# Patient Record
Sex: Male | Born: 1985 | Race: Black or African American | Hispanic: No | Marital: Single | Smoking: Never smoker
Health system: Southern US, Community
[De-identification: ages and names within clinical notes are randomized; demographics above are authoritative.]

---

## 2011-12-16 ENCOUNTER — Emergency Department (HOSPITAL_COMMUNITY)
Admission: EM | Admit: 2011-12-16 | Discharge: 2011-12-16 | Disposition: A | Discharge status: Home / Self Care | Payer: Self-pay | Attending: Emergency Medicine | Admitting: Emergency Medicine

## 2011-12-16 DIAGNOSIS — K047 Periapical abscess without sinus: Secondary | ICD-10-CM | POA: Insufficient documentation

## 2011-12-16 DIAGNOSIS — K029 Dental caries, unspecified: Secondary | ICD-10-CM | POA: Insufficient documentation

## 2011-12-16 DIAGNOSIS — R220 Localized swelling, mass and lump, head: Secondary | ICD-10-CM | POA: Insufficient documentation

## 2011-12-16 DIAGNOSIS — K089 Disorder of teeth and supporting structures, unspecified: Secondary | ICD-10-CM | POA: Insufficient documentation

## 2011-12-16 MED ORDER — AMOXICILLIN 500 MG PO CAPS: 500.0000 mg | ORAL_CAPSULE | Freq: Three times a day (TID) | ORAL | Status: AC

## 2011-12-16 MED ORDER — AMOXICILLIN 500 MG PO CAPS
1000.0000 mg | ORAL_CAPSULE | Freq: Once | ORAL | Status: AC
  Administered 2011-12-16: 1000 mg via ORAL
  Filled 2011-12-16: qty 2

## 2011-12-16 MED ORDER — OXYCODONE-ACETAMINOPHEN 5-325 MG PO TABS: 2.0000 | ORAL_TABLET | ORAL | Status: AC | PRN

## 2011-12-16 MED ORDER — HYDROCODONE-ACETAMINOPHEN 5-325 MG PO TABS
2.0000 | ORAL_TABLET | Freq: Once | ORAL | Status: AC
  Administered 2011-12-16: 2 via ORAL
  Filled 2011-12-16: qty 2

## 2011-12-16 NOTE — ED Provider Notes (Signed)
History     CSN: [id number] Arrival date & time: 12/16/2011 12:13 PM   First MD Initiated Contact with Patient 12/16/11 1551      Chief Complaint  Patient presents with  . Dental Pain    (Consider location/radiation/quality/duration/timing/severity/associated sxs/prior treatment) HPI... right lower tooth pain for several days. States filling fell out.  No fever, chills, stiff neck. The patient makes it worse. Slight facial swelling  Past Medical History  Diagnosis Date  . Asthma     History reviewed. No pertinent past surgical history.  History reviewed. No pertinent family history.  History  Substance Use Topics  . Smoking status: Never Smoker   . Smokeless tobacco: Not on file  . Alcohol Use: No      Review of Systems  All other systems reviewed and are negative.    Allergies  Review of patient's allergies indicates no known allergies.  Home Medications   Current Outpatient Rx  Name Route Sig Dispense Refill  . AMOXICILLIN 500 MG PO CAPS Oral Take 1 capsule (500 mg total) by mouth 3 (three) times daily. 30 capsule 0  . OXYCODONE-ACETAMINOPHEN 5-325 MG PO TABS Oral Take 2 tablets by mouth every 4 (four) hours as needed for pain. 20 tablet 0    BP 131/71  Pulse 69  Temp(Src) 98.8 F (37.1 C) (Oral)  Resp 8  Ht 6\' 4"  (1.93 m)  Wt 195 lb (88.451 kg)  BMI 23.74 kg/m2  SpO2 100%  Physical Exam  Nursing note and vitals reviewed. Constitutional: He is oriented to person, place, and time. He appears well-developed and well-nourished.       Respirations 18  HENT:  Mouth/Throat:         Decayed tooth with filling out  Neck: Normal range of motion. Neck supple.  Musculoskeletal: Normal range of motion.  Neurological: He is alert and oriented to person, place, and time. He has normal reflexes.  Skin: Skin is warm and dry.  Psychiatric: He has a normal mood and affect.    ED Course  Procedures (including critical care time)  Labs Reviewed - No data  to display No results found.   1. Dental abscess       MDM  Antibiotics and pain meds, referral to dentist        Donnetta Hutching, MD 12/16/11 [phone]

## 2011-12-16 NOTE — ED Notes (Signed)
Rt. Lower toothache for  A few day, facial swelling and pain noted.  Pt. Reports that filling fell out.

## 2012-07-26 ENCOUNTER — Emergency Department (HOSPITAL_COMMUNITY)
Admission: EM | Admit: 2012-07-26 | Discharge: 2012-07-26 | Disposition: A | Discharge status: Home / Self Care | Payer: No Typology Code available for payment source | Attending: Emergency Medicine | Admitting: Emergency Medicine

## 2012-07-26 ENCOUNTER — Emergency Department (HOSPITAL_COMMUNITY): Payer: No Typology Code available for payment source

## 2012-07-26 ENCOUNTER — Encounter (HOSPITAL_COMMUNITY): Payer: Self-pay | Admitting: *Deleted

## 2012-07-26 DIAGNOSIS — S8000XA Contusion of unspecified knee, initial encounter: Secondary | ICD-10-CM | POA: Insufficient documentation

## 2012-07-26 DIAGNOSIS — S39012A Strain of muscle, fascia and tendon of lower back, initial encounter: Secondary | ICD-10-CM

## 2012-07-26 DIAGNOSIS — Y92410 Unspecified street and highway as the place of occurrence of the external cause: Secondary | ICD-10-CM | POA: Insufficient documentation

## 2012-07-26 DIAGNOSIS — S335XXA Sprain of ligaments of lumbar spine, initial encounter: Secondary | ICD-10-CM | POA: Insufficient documentation

## 2012-07-26 MED ORDER — TRAMADOL HCL 50 MG PO TABS: 50.0000 mg | ORAL_TABLET | Freq: Four times a day (QID) | ORAL | Status: AC | PRN
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
## 2016-03-25 ENCOUNTER — Encounter (HOSPITAL_COMMUNITY): Payer: Self-pay | Admitting: Emergency Medicine

## 2016-03-25 ENCOUNTER — Emergency Department (HOSPITAL_COMMUNITY)
Admission: EM | Admit: 2016-03-25 | Discharge: 2016-03-25 | Disposition: A | Discharge status: Home / Self Care | Payer: No Typology Code available for payment source | Attending: Emergency Medicine | Admitting: Emergency Medicine

## 2016-03-25 DIAGNOSIS — X12XXXA Contact with other hot fluids, initial encounter: Secondary | ICD-10-CM | POA: Insufficient documentation

## 2016-03-25 DIAGNOSIS — Z23 Encounter for immunization: Secondary | ICD-10-CM | POA: Insufficient documentation

## 2016-03-25 DIAGNOSIS — Y9289 Other specified places as the place of occurrence of the external cause: Secondary | ICD-10-CM | POA: Insufficient documentation

## 2016-03-25 DIAGNOSIS — J45909 Unspecified asthma, uncomplicated: Secondary | ICD-10-CM | POA: Insufficient documentation

## 2016-03-25 DIAGNOSIS — Y9389 Activity, other specified: Secondary | ICD-10-CM | POA: Insufficient documentation

## 2016-03-25 DIAGNOSIS — Y990 Civilian activity done for income or pay: Secondary | ICD-10-CM | POA: Insufficient documentation

## 2016-03-25 DIAGNOSIS — T23102A Burn of first degree of left hand, unspecified site, initial encounter: Secondary | ICD-10-CM | POA: Insufficient documentation

## 2016-03-25 MED ORDER — OXYCODONE-ACETAMINOPHEN 5-325 MG PO TABS
2.0000 | ORAL_TABLET | Freq: Once | ORAL | Status: AC
  Administered 2016-03-25: 2 via ORAL
  Filled 2016-03-25: qty 2

## 2016-03-25 MED ORDER — SILVER SULFADIAZINE 1 % EX CREA
TOPICAL_CREAM | Freq: Once | CUTANEOUS | Status: AC
  Administered 2016-03-25: 04:00:00 via TOPICAL
  Filled 2016-03-25: qty 50

## 2016-03-25 MED ORDER — OXYCODONE HCL 5 MG PO TABS
5.0000 mg | ORAL_TABLET | Freq: Two times a day (BID) | ORAL | Status: AC | PRN
Start: 2016-03-25 — End: ?

## 2016-03-25 MED ORDER — TETANUS-DIPHTH-ACELL PERTUSSIS 5-2.5-18.5 LF-MCG/0.5 IM SUSP
0.5000 mL | Freq: Once | INTRAMUSCULAR | Status: AC
  Administered 2016-03-25: 0.5 mL via INTRAMUSCULAR
  Filled 2016-03-25: qty 0.5
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
## 2016-03-25 NOTE — Discharge Instructions (Signed)
[name], use silvadene cream 3 times per day until burn has completely healed.  See a primary care doctor within 3 days for wound check.  For any worsening symptoms or pain, come back to the ED immediately. Thank you. Burns hurt your skin. When your skin is hurt, it is easier to get an infection. Follow your doctor's directions to help prevent an infection. HOME CARE  Wash your hands well before you change your bandage.  Change your bandage as often as told by your doctor.  Remove the old bandage. If the bandage sticks, soak it off with cool, clean water.  Gently clean the burn with mild soap and water.  Pat the burn dry with a clean, dry cloth.  Put a thin layer of medicated cream on the burn.  Put a clean bandage on as told by your doctor.  Keep the bandage clean and dry.  Raise (elevate) the burn for the first 24 hours. After that, follow your doctor's directions.  Only take medicine as told by your doctor. GET HELP RIGHT AWAY IF:   You have too much pain.  The skin near the burn is red, tender, puffy (swollen), or has red streaks.  The burn area has yellowish white fluid (pus) or a bad smell coming from it.  You have a fever. MAKE SURE YOU:   Understand these instructions.  Will watch your condition.  Will get help right away if you are not doing well or get worse.   This information is not intended to replace advice given to you by your health care provider. Make sure you discuss any questions you have with your health care provider.   Document Released: 09/23/2008 Document Revised: 03/08/2012 Document Reviewed: 05/07/2011 Elsevier Interactive Patient Education [employer]2016 [employer] Inc.

## 2018-03-25 ENCOUNTER — Other Ambulatory Visit: Payer: Self-pay

## 2018-03-25 ENCOUNTER — Encounter (HOSPITAL_COMMUNITY): Payer: Self-pay | Admitting: Emergency Medicine

## 2018-03-25 ENCOUNTER — Emergency Department (HOSPITAL_COMMUNITY)
Admission: EM | Admit: 2018-03-25 | Discharge: 2018-03-25 | Disposition: A | Discharge status: Home / Self Care | Payer: Self-pay | Attending: Emergency Medicine | Admitting: Emergency Medicine

## 2018-03-25 DIAGNOSIS — R51 Headache: Secondary | ICD-10-CM | POA: Insufficient documentation

## 2018-03-25 DIAGNOSIS — R220 Localized swelling, mass and lump, head: Secondary | ICD-10-CM | POA: Insufficient documentation

## 2018-03-25 DIAGNOSIS — J45909 Unspecified asthma, uncomplicated: Secondary | ICD-10-CM | POA: Insufficient documentation

## 2018-03-25 DIAGNOSIS — K0889 Other specified disorders of teeth and supporting structures: Secondary | ICD-10-CM | POA: Insufficient documentation

## 2018-03-25 MED ORDER — PENICILLIN V POTASSIUM 500 MG PO TABS: 500.0000 mg | ORAL_TABLET | Freq: Four times a day (QID) | ORAL | 0 refills | Status: AC

## 2018-03-25 NOTE — ED Triage Notes (Addendum)
Pt reports 3 week hx of pain and pressure in r/side of face. Pt c/o pain in upper and lower teeth. Swelling noted in r/cheek.  Pt drove self to ED. Medicated with Tylenol at 3am today

## 2018-03-25 NOTE — Discharge Instructions (Signed)
Please take all of your antibiotics until finished!   You may develop abdominal discomfort or diarrhea from the antibiotic.  You may help offset this with probiotics which you can buy or get in yogurt. Do not eat  or take the probiotics until 2 hours after your antibiotic.  ° °Apply warm compresses to jaw throughout the day. Alternate 600 mg of ibuprofen and 500-1000 mg of Tylenol every 3 hours as needed for pain. Do not exceed 4000 mg of Tylenol daily.  You may also use warm water salt gargles, Orajel, or other over-the-counter dental pain remedies. Followup with a dentist is very important for ongoing evaluation and management of recurrent dental pain. Return to emergency department for emergent changing or worsening symptoms such as fever, worsening facial swelling, difficulty breathing or swallowing, throat tightness, or vision changes. ° °

## 2018-03-25 NOTE — ED Provider Notes (Signed)
[facility]-EMERGENCY DEPT Provider Note   CSN: [id number] Arrival date & time: 03/25/18  4098     History   Chief Complaint Chief Complaint  Patient presents with  . Dental Pain  . Facial Swelling    HPI Kevin Gay is a 32 y.o. male presents today for evaluation of gradual onset, progressively worsening right-sided upper and lower dental pain for 3 weeks. He notes constant aching throbbing pain to the right maxilla and mandible with intermittent radiation up to the right side of the face.  He notes improvement with Tylenol and Orajel.  He denies fevers or chills.  No drooling.  No shortness of breath.  He has not seen a dentist in "a while "but intends on going to a walk-in clinic tomorrow.  The history is provided by the patient.    Past Medical History:  Diagnosis Date  . Asthma     There are no active problems to display for this patient.   History reviewed. No pertinent surgical history.      Home Medications    Prior to Admission medications   Medication Sig Start Date End Date Taking? Authorizing Provider  oxyCODONE (ROXICODONE) 5 MG immediate release tablet Take 1 tablet (5 mg total) by mouth 2 (two) times daily as needed for severe pain. Patient not taking: Reported on 03/25/2018 03/25/16   Tomasita Crumble, MD  penicillin v potassium (VEETID) 500 MG tablet Take 1 tablet (500 mg total) by mouth 4 (four) times daily for 7 days. 03/25/18 04/01/18  Jeanie Sewer, PA-C    Family History History reviewed. No pertinent family history.  Social History Social History   Tobacco Use  . Smoking status: Never Smoker  Substance Use Topics  . Alcohol use: No  . Drug use: No     Allergies   Almond oil   Review of Systems Review of Systems  Constitutional: Negative for chills and fever.  HENT: Positive for dental problem. Negative for drooling, trouble swallowing and voice change.   Respiratory: Negative for shortness of breath.     Musculoskeletal: Negative for neck pain and neck stiffness.  Neurological: Positive for headaches.     Physical Exam Updated Vital Signs BP (!) 154/90 (BP Location: Right Arm)   Pulse 66   Temp 98.2 F (36.8 C) (Oral)   Resp 18   Wt 99.8 kg (220 lb)   SpO2 100%   BMI 26.78 kg/m   Physical Exam  Constitutional: He appears well-developed and well-nourished. No distress.  HENT:  Head: Normocephalic and atraumatic.  Dentition appears to be stable. No noted area of swelling or fluctuance. No trismus. Mouth opening to at least 3 finger widths. Handles oral secretions without difficulty. No noted facial swelling. No swelling or tenderness to the submental or submandibular regions. No swelling or tenderness into the soft tissues of the neck. He has mildly decaying dentition throughout with some missing dentition of the right mandible.  Mild tenderness to percussion of the right upper and lower dentition with no focal tenderness or area of fluctuance.  No significant gingival irritation noted    Eyes: Pupils are equal, round, and reactive to light. Conjunctivae and EOM are normal. Right eye exhibits no discharge. Left eye exhibits no discharge.  Neck: Normal range of motion. Neck supple. No JVD present. No tracheal deviation present.  Cardiovascular: Normal rate and regular rhythm.  Pulmonary/Chest: Effort normal and breath sounds normal.  Abdominal: He exhibits no distension.  Musculoskeletal: He exhibits no  edema.  Neurological: He is alert.  Skin: Skin is warm and dry. No erythema.  Psychiatric: He has a normal mood and affect. His behavior is normal.  Nursing note and vitals reviewed.    ED Treatments / Results  Labs (all labs ordered are listed, but only abnormal results are displayed) Labs Reviewed - No data to display  EKG None  Radiology No results found.  Procedures Procedures (including critical care time)  Medications Ordered in ED Medications - No data  to display   Initial Impression / Assessment and Plan / ED Course  I have reviewed the triage vital signs and the nursing notes.  Pertinent labs & imaging results that were available during my care of the patient were reviewed by me and considered in my medical decision making (see chart for details).     Patient with toothache.  Afebrile, vital signs are stable.  He is nontoxic in appearance.  Tolerating secretions without difficulty.  Speaking in full sentences without difficulty.  No angioedema noted.  No gross abscess.  Exam unconcerning for Ludwig's angina or spread of infection.  Will treat with penicillin and NSAIDs/Tylenol.  Urged patient to follow-up with dentist.  He states he will go to walk-in clinic tomorrow.  Discussed indications for return to the ED. Pt verbalized understanding of and agreement with plan and is safe for discharge home at this time.   Final Clinical Impressions(s) / ED Diagnoses   Final diagnoses:  Pain, dental    ED Discharge Orders        Ordered    penicillin v potassium (VEETID) 500 MG tablet  4 times daily     03/25/18 [id number]       Venice Marcucci A, PA-C 03/25/18 1203    Azalia Bilisampos, Kevin, MD 04/02/18 1218

## 2023-03-18 ENCOUNTER — Ambulatory Visit: Payer: Self-pay

## 2023-03-20 ENCOUNTER — Encounter: Payer: Self-pay | Admitting: Family

## 2023-03-20 ENCOUNTER — Ambulatory Visit: Payer: Self-pay | Admitting: Family

## 2023-03-20 DIAGNOSIS — Z113 Encounter for screening for infections with a predominantly sexual mode of transmission: Secondary | ICD-10-CM

## 2023-03-20 LAB — HM HIV SCREENING LAB: HM HIV Screening: NEGATIVE

## 2023-03-20 LAB — HM HEPATITIS C SCREENING LAB: HM Hepatitis Screen: NEGATIVE

## 2023-03-20 NOTE — Progress Notes (Signed)
Pt is here for STD screening.  Condoms given.  Chadwick Reiswig M Caryl Fate, RN  

## 2023-03-20 NOTE — Progress Notes (Signed)
[facility] Department STI clinic/screening visit  Subjective:  Danell Sisk is a 37 y.o. male being seen today for an STI screening visit. The patient reports they do not have symptoms.    Patient has the following medical conditions:  There are no problems to display for this patient.    Chief Complaint  Patient presents with   SEXUALLY TRANSMITTED DISEASE    Screening    HPI  Patient reports wants STI testing because he and his girlfriend tried anal sex in June 2023, which caused the skin on his penis to tear. States that it is still healing but wants it checked to make sure. Also, cancer runs in his family so he wants a testicular exam. He denies STI symptoms or exposure to STIs.  Last HIV test per patient/review of record was No results found for: "HMHIVSCREEN" No results found for: "HIV"  Does the patient or their partner desires a pregnancy in the next year? No  Screening for MPX risk: Does the patient have an unexplained rash? No Is the patient MSM? No Does the patient endorse multiple sex partners or anonymous sex partners? No Did the patient have close or sexual contact with a person diagnosed with MPX? No Has the patient traveled outside the [place] where MPX is endemic? No Is there a high clinical suspicion for MPX-- evidenced by one of the following No  -Unlikely to be chickenpox  -Lymphadenopathy  -Rash that present in same phase of evolution on any given body part   See flowsheet for further details and programmatic requirements.   Immunization History  Administered Date(s) Administered   Tdap 03/25/2016     The following portions of the patient's history were reviewed and updated as appropriate: allergies, current medications, past medical history, past social history, past surgical history and problem list.  Objective:  There were no vitals filed for this visit.  Physical Exam Constitutional:      Appearance: Normal appearance.  HENT:      Head: Normocephalic and atraumatic.     Comments: No nits or hair loss    Mouth/Throat:     Mouth: Mucous membranes are moist. No oral lesions.     Pharynx: Oropharynx is clear. No oropharyngeal exudate or posterior oropharyngeal erythema.  Eyes:     General:        Right eye: No discharge.        Left eye: No discharge.     Conjunctiva/sclera:     Right eye: Right conjunctiva is not injected. No exudate.    Left eye: Left conjunctiva is not injected. No exudate. Pulmonary:     Effort: Pulmonary effort is normal.  Abdominal:     General: Abdomen is flat.     Palpations: Abdomen is soft. There is no hepatomegaly or mass.     Tenderness: There is no abdominal tenderness. There is no rebound.     Hernia: There is no hernia in the left inguinal area or right inguinal area.  Genitourinary:    Pubic Area: No rash or pubic lice (no nits).      Penis: Circumcised. No tenderness, discharge, swelling or lesions.      Testes: Normal.     Epididymis:     Right: Normal. No mass or tenderness.     Left: Normal. No mass or tenderness.     Rectum: Normal. No tenderness (no lesions or discharge).     Comments: thick dry scar tissue on penis head adjacent to urethral  meatus and on penis shaft adjacent to corona Lymphadenopathy:     Head:     Right side of head: No preauricular or posterior auricular adenopathy.     Left side of head: No preauricular or posterior auricular adenopathy.     Cervical: No cervical adenopathy.     Upper Body:     Right upper body: No supraclavicular, axillary or epitrochlear adenopathy.     Left upper body: No supraclavicular, axillary or epitrochlear adenopathy.     Lower Body: No right inguinal adenopathy. No left inguinal adenopathy.  Skin:    General: Skin is warm and dry.     Findings: No lesion or rash.  Neurological:     Mental Status: He is alert and oriented to person, place, and time.       Assessment and Plan:  Clancy Lortz is a 37 y.o. male  presenting to the [facility] for STI screening  1. Screening for venereal disease Advised against anal sex, discussed male and male risks Apply cocoa butter cream to resolve scarred areas  Will contact with positive results Use condoms for all sex   Patient does not have STI symptoms Patient accepted all screenings including  urine GC/Chlamydia, and blood work for HIV/Syphilis. Patient meets criteria for HepB screening? No. Ordered? no Patient meets criteria for HepC screening? No. Ordered? yes Recommended condom use with all sex Discussed importance of condom use for STI prevent  Treat positive test results per standing order. Discussed time line for [facility] results and that patient will be called with positive results and encouraged patient to call if he had not heard in 2 weeks Recommended repeat testing in 3 months with positive results. Recommended returning for continued or worsening symptoms.   Return if symptoms worsen or fail to improve.  No future appointments.  Marline Backbone, FNP

## 2023-03-27 LAB — CHLAMYDIA/GC NAA, CONFIRMATION
Chlamydia trachomatis, NAA: POSITIVE — AB
Neisseria gonorrhoeae, NAA: NEGATIVE

## 2023-03-27 LAB — C. TRACHOMATIS NAA, CONFIRM: C. trachomatis NAA, Confirm: POSITIVE — AB

## 2023-03-27 LAB — GONOCOCCUS CULTURE: GC Culture Only: NEGATIVE

## 2023-03-30 ENCOUNTER — Telehealth: Payer: Self-pay

## 2023-03-30 NOTE — Telephone Encounter (Signed)
Pt notified of positive Chlamydia results.  Treatment appointment made for 03/31/2023

## 2023-03-30 NOTE — Telephone Encounter (Signed)
Unable to leave a message - VM is not set up.

## 2023-03-31 ENCOUNTER — Ambulatory Visit: Payer: Self-pay

## 2023-03-31 DIAGNOSIS — A749 Chlamydial infection, unspecified: Secondary | ICD-10-CM

## 2023-03-31 MED ORDER — DOXYCYCLINE HYCLATE 100 MG PO TABS: 100.0000 mg | ORAL_TABLET | Freq: Two times a day (BID) | ORAL | 0 refills | Status: AC

## 2023-03-31 NOTE — Progress Notes (Signed)
Pt is here for treatment of Chlamydia.  The patient was dispensed Doxycycline 100 mg #14 today. I provided counseling today regarding the medication. We discussed the medication, the side effects and when to call clinic. Patient given the opportunity to ask questions. Questions answered. Giabella Duhart M Jenaveve Fenstermaker, RN   

## 2023-04-21 NOTE — Addendum Note (Signed)
Addended by: Heywood Bene on: 04/21/2023 03:26 PM   Modules accepted: Orders

## 2023-07-10 ENCOUNTER — Other Ambulatory Visit: Payer: Self-pay

## 2023-07-10 ENCOUNTER — Emergency Department: Payer: Medicaid Other

## 2023-07-10 ENCOUNTER — Emergency Department
Admission: EM | Admit: 2023-07-10 | Discharge: 2023-07-11 | Disposition: A | Discharge status: Home / Self Care | Payer: Medicaid Other | Attending: Emergency Medicine | Admitting: Emergency Medicine

## 2023-07-10 DIAGNOSIS — S63259A Unspecified dislocation of unspecified finger, initial encounter: Secondary | ICD-10-CM

## 2023-07-10 DIAGNOSIS — X501XXA Overexertion from prolonged static or awkward postures, initial encounter: Secondary | ICD-10-CM | POA: Diagnosis not present

## 2023-07-10 DIAGNOSIS — S63256A Unspecified dislocation of right little finger, initial encounter: Secondary | ICD-10-CM | POA: Diagnosis not present

## 2023-07-10 DIAGNOSIS — S6991XA Unspecified injury of right wrist, hand and finger(s), initial encounter: Secondary | ICD-10-CM | POA: Diagnosis present

## 2023-07-10 NOTE — ED Provider Notes (Signed)
[facility] Provider Note    Event Date/Time   First MD Initiated Contact with Patient 07/10/23 2347     (approximate)   History   Finger Injury   HPI  Kevin Gay is a 37 y.o. male  who presents to the emergency department today because of concern for right little finger injury. Girlfriend apparently grabbed the finger while they were messing around and it got twisted. Had previously injured that finger when he was a kid. Denies any other injuries or complaints.     Physical Exam   Triage Vital Signs: ED Triage Vitals  Encounter Vitals Group     BP 07/10/23 2256 134/87     Systolic BP Percentile --      Diastolic BP Percentile --      Pulse Rate 07/10/23 2256 79     Resp 07/10/23 2256 (!) 22     Temp 07/10/23 2256 98.3 F (36.8 C)     Temp Source 07/10/23 2256 Oral     SpO2 07/10/23 2256 98 %     Weight 07/10/23 2255 205 lb (93 kg)     Height 07/10/23 2255 6\' 4"  (1.93 m)     Head Circumference --      Peak Flow --      Pain Score 07/10/23 2254 10     Pain Loc --      Pain Education --      Exclude from Growth Chart --     Most recent vital signs: Vitals:   07/10/23 2256  BP: 134/87  Pulse: 79  Resp: (!) 22  Temp: 98.3 F (36.8 C)  SpO2: 98%   General: Awake, alert, oriented. CV:  Good peripheral perfusion. Regular rate and rhythm. Resp:  Normal effort. Lungs clear. Abd:  No distention.  Other:  Deformity to right little finger.   ED Results / Procedures / Treatments   Labs (all labs ordered are listed, but only abnormal results are displayed) Labs Reviewed - No data to display   EKG  None   RADIOLOGY I independently interpreted and visualized the right little finger x-ray. My interpretation: dislocation of the MCP Radiology interpretation:  IMPRESSION:  Lateral dislocation of the right small finger proximal phalanx in  relation to the distal fifth metacarpal, with mild overriding and  with radial rotation of  the finger.   I independently interpreted and visualized the right little finger. My interpretation: successful reduction Radiology interpretation:  IMPRESSION:  Successful reduction of the fifth MCP joint dislocation.     PROCEDURES:  Critical Care performed: No  Procedures  Reduction of dislocation Date/Time: 12:02 AM Performed by: Phineas Semen Authorized by: Phineas Semen Consent: Verbal consent obtained. Risks and benefits: risks, benefits and alternatives were discussed Consent given by: patient Required items: required blood products, implants, devices, and special equipment available Time out: Immediately prior to procedure a "time out" was called to verify the correct patient, procedure, equipment, support staff and site/side marked as required.  Patient sedated: No  Vitals: Vital signs were monitored during sedation. Patient tolerance: Patient tolerated the procedure well with no immediate complications. Joint: right fifth MCP Reduction technique: traction    MEDICATIONS ORDERED IN ED: Medications - No data to display   IMPRESSION / MDM / ASSESSMENT AND PLAN / ED COURSE  I reviewed the triage vital signs and the nursing notes.  Differential diagnosis includes, but is not limited to, dislocation, fracture  Patient's presentation is most consistent with acute presentation with potential threat to life or bodily function.   Patient presented to the emergency department today because of concerns for injury and deformity to his right little finger.  X-rays consistent with dislocation.  The joint was successfully reduced.  Did place finger in splint.  Will give orthopedic follow-up information.     FINAL CLINICAL IMPRESSION(S) / ED DIAGNOSES   Final diagnoses:  Dislocation of finger, initial encounter      Note:  This document was prepared using Dragon voice recognition software and may include unintentional dictation  errors.    Phineas Semen, MD 07/11/23 [phone]

## 2023-07-10 NOTE — ED Triage Notes (Addendum)
Pt to ED via POV c/o injury to right pinky finger. Pt was play fighting when he injured his finger, obvious deformity to finger, finger is at 90 degree angle, some swelling noted to finger.

## 2023-07-10 NOTE — ED Provider Notes (Incomplete)
[facility] Provider Note    Event Date/Time   First MD Initiated Contact with Patient 07/10/23 2347     (approximate)   History   Finger Injury   HPI  Oras Splitt is a 37 y.o. male  who presents to the emergency department today because of concern for right little finger injury. Girlfriend apparently grabbed the finger while they were messing around and it got twisted. Had previously injured that finger when he was a kid. Denies any other injuries or complaints.     Physical Exam   Triage Vital Signs: ED Triage Vitals  Encounter Vitals Group     BP 07/10/23 2256 134/87     Systolic BP Percentile --      Diastolic BP Percentile --      Pulse Rate 07/10/23 2256 79     Resp 07/10/23 2256 (!) 22     Temp 07/10/23 2256 98.3 F (36.8 C)     Temp Source 07/10/23 2256 Oral     SpO2 07/10/23 2256 98 %     Weight 07/10/23 2255 205 lb (93 kg)     Height 07/10/23 2255 6\' 4"  (1.93 m)     Head Circumference --      Peak Flow --      Pain Score 07/10/23 2254 10     Pain Loc --      Pain Education --      Exclude from Growth Chart --     Most recent vital signs: Vitals:   07/10/23 2256  BP: 134/87  Pulse: 79  Resp: (!) 22  Temp: 98.3 F (36.8 C)  SpO2: 98%   General: Awake, alert, oriented. CV:  Good peripheral perfusion. Regular rate and rhythm. Resp:  Normal effort. Lungs clear. Abd:  No distention.  Other:  Deformity to right little finger.   ED Results / Procedures / Treatments   Labs (all labs ordered are listed, but only abnormal results are displayed) Labs Reviewed - No data to display   EKG  ***   RADIOLOGY I independently interpreted and visualized the right little finger x-ray. My interpretation: dislocation of the MCP Radiology interpretation:  IMPRESSION:  Lateral dislocation of the right small finger proximal phalanx in  relation to the distal fifth metacarpal, with mild overriding and  with radial rotation of the  finger.   I independently interpreted and visualized the right little finger. My interpretation: *** Radiology interpretation: ***   PROCEDURES:  Critical Care performed: No  Procedures  Reduction of dislocation Date/Time: 12:02 AM Performed by: Phineas Semen Authorized by: Phineas Semen Consent: Verbal consent obtained. Risks and benefits: risks, benefits and alternatives were discussed Consent given by: patient Required items: required blood products, implants, devices, and special equipment available Time out: Immediately prior to procedure a "time out" was called to verify the correct patient, procedure, equipment, support staff and site/side marked as required.  Patient sedated: No  Vitals: Vital signs were monitored during sedation. Patient tolerance: Patient tolerated the procedure well with no immediate complications. Joint: right fifth MCP Reduction technique: traction    MEDICATIONS ORDERED IN ED: Medications - No data to display   IMPRESSION / MDM / ASSESSMENT AND PLAN / ED COURSE  I reviewed the triage vital signs and the nursing notes.                              Differential diagnosis includes, but  is not limited to, ***  Patient's presentation is most consistent with {EM COPA:27473}   ***The patient is on the cardiac monitor to evaluate for evidence of arrhythmia and/or significant heart rate changes.  ***      FINAL CLINICAL IMPRESSION(S) / ED DIAGNOSES   Final diagnoses:  None     Rx / DC Orders   ED Discharge Orders     None        Note:  This document was prepared using Dragon voice recognition software and may include unintentional dictation errors.

## 2023-07-11 ENCOUNTER — Emergency Department: Payer: Medicaid Other

## 2023-07-11 NOTE — ED Notes (Signed)
Provider at bedside, finger reset, pt A&O x4, no obvious distress noted.

## 2023-07-11 NOTE — ED Notes (Signed)
Pt A&O x4, no obvious distress noted, respirations regular/unlabored. Pt verbalizes understanding of discharge instructions. Pt able to ambulate from ED independently.   

## 2023-07-11 NOTE — Discharge Instructions (Signed)
Please seek medical attention for any high fevers, chest pain, shortness of breath, change in behavior, persistent vomiting, bloody stool or any other new or concerning symptoms.
